# Patient Record
Sex: Male | Born: 1980 | Race: White | Hispanic: No | Marital: Single | Smoking: Never smoker
Health system: Southern US, Community
[De-identification: ages and names within clinical notes are randomized; demographics above are authoritative.]

## PROBLEM LIST (undated history)

## (undated) DIAGNOSIS — T783XXA Angioneurotic edema, initial encounter: Secondary | ICD-10-CM

## (undated) HISTORY — PX: SINOSCOPY: SHX187

## (undated) HISTORY — DX: Angioneurotic edema, initial encounter: T78.3XXA

---

## 2019-09-18 ENCOUNTER — Telehealth: Payer: Self-pay | Admitting: Allergy & Immunology

## 2019-09-18 NOTE — Telephone Encounter (Signed)
Patient called over the weekend to let me know that he was diagnosed with COVID19 over Christmas. Diagnosis date was actually December 24th. I told him that we would have to reschedule him. 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   Please call on Monday December 28th to reschedule.  Salvatore Marvel, MD [facility] [place] [facility] [place]

## 2019-09-20 ENCOUNTER — Ambulatory Visit: Payer: Self-pay | Admitting: Allergy and Immunology

## 2019-10-15 ENCOUNTER — Other Ambulatory Visit: Payer: Self-pay

## 2019-10-15 ENCOUNTER — Emergency Department (HOSPITAL_COMMUNITY)
Admission: EM | Admit: 2019-10-15 | Discharge: 2019-10-15 | Disposition: A | Discharge status: Home / Self Care | Attending: Emergency Medicine | Admitting: Emergency Medicine

## 2019-10-15 ENCOUNTER — Emergency Department (HOSPITAL_COMMUNITY)

## 2019-10-15 ENCOUNTER — Encounter (HOSPITAL_COMMUNITY): Payer: Self-pay | Admitting: Emergency Medicine

## 2019-10-15 DIAGNOSIS — S92252A Displaced fracture of navicular [scaphoid] of left foot, initial encounter for closed fracture: Secondary | ICD-10-CM | POA: Insufficient documentation

## 2019-10-15 DIAGNOSIS — X500XXA Overexertion from strenuous movement or load, initial encounter: Secondary | ICD-10-CM | POA: Insufficient documentation

## 2019-10-15 DIAGNOSIS — Y929 Unspecified place or not applicable: Secondary | ICD-10-CM | POA: Diagnosis not present

## 2019-10-15 DIAGNOSIS — Y939 Activity, unspecified: Secondary | ICD-10-CM | POA: Diagnosis not present

## 2019-10-15 DIAGNOSIS — S99912A Unspecified injury of left ankle, initial encounter: Secondary | ICD-10-CM | POA: Diagnosis present

## 2019-10-15 DIAGNOSIS — Y999 Unspecified external cause status: Secondary | ICD-10-CM | POA: Insufficient documentation

## 2019-10-15 NOTE — ED Notes (Signed)
Ortho tech paged  

## 2019-10-15 NOTE — ED Notes (Signed)
Verbalized understanding of d/c instruction, follow up care and pain management. Pt had no further questions at this time

## 2019-10-15 NOTE — Progress Notes (Signed)
Orthopedic Tech Progress Note Patient Details:  Nathan Middleton May 11, 1981 [id number]  Ortho Devices Type of Ortho Device: CAM walker Ortho Device/Splint Location: lle Ortho Device/Splint Interventions: Ordered, Application, Adjustment   Post Interventions Patient Tolerated: Well Instructions Provided: Care of device, Adjustment of device   Trinna Post 10/15/2019, 11:29 PM

## 2019-10-15 NOTE — Discharge Instructions (Addendum)
We recommend to wear boot for now. Continue to ice/elevate ankle to help control swelling.  Can take 800mg  motrin up to 3x daily. Follow-up with orthopedics-- call their office as soon as you can to make follow-up appt. Return here for any new/acute changes.

## 2019-10-15 NOTE — ED Provider Notes (Signed)
[facility] EMERGENCY DEPARTMENT Provider Note   CSN: [id number] Arrival date & time: 10/15/19  2122     History Chief Complaint  Patient presents with  . Left Ankle Injury    Nathan Middleton is a 39 y.o. male.  The history is provided by the patient and medical records.    39 y.o. M here with left ankle injury.  States he has twisted his ankle twice over the past week after stepping awkwardly on an incline at work.  He has been wearing ace wrap and tightening his boots more which has been helping.  Denies numbness/weakness.  He remains ambulatory with fairly little pain.  History reviewed. No pertinent past medical history.  There are no problems to display for this patient.   History reviewed. No pertinent surgical history.     No family history on file.  Social History   Tobacco Use  . Smoking status: Never Smoker  . Smokeless tobacco: Never Used  Substance Use Topics  . Alcohol use: Yes  . Drug use: Never    Home Medications Prior to Admission medications   Not on File    Allergies    Penicillins and Sulfa antibiotics  Review of Systems   Review of Systems  Musculoskeletal: Positive for arthralgias.  All other systems reviewed and are negative.   Physical Exam Updated Vital Signs BP (!) 123/94 (BP Location: Right Arm)   Pulse 100   Temp 98.7 F (37.1 C) (Oral)   Resp 14   SpO2 96%   Physical Exam Vitals and nursing note reviewed.  Constitutional:      Appearance: He is well-developed.  HENT:     Head: Normocephalic and atraumatic.  Eyes:     Conjunctiva/sclera: Conjunctivae normal.     Pupils: Pupils are equal, round, and reactive to light.  Cardiovascular:     Rate and Rhythm: Normal rate and regular rhythm.     Heart sounds: Normal heart sounds.  Pulmonary:     Effort: Pulmonary effort is normal.     Breath sounds: Normal breath sounds.  Abdominal:     General: Bowel sounds are normal.     Palpations: Abdomen is  soft.  Musculoskeletal:        General: Normal range of motion.     Cervical back: Normal range of motion.     Comments: Left ankle with swelling around the medial and lateral malleoli, no acute deformity, some very mild tenderness along proximal dorsal foot without gross deformity; some bruising along base of the heel, no open wounds/sores; DP pulse intact; full ROM maintained, some mild pain with dorsal/plantarflexion, good cap refill and distal sensation  Skin:    General: Skin is warm and dry.  Neurological:     Mental Status: He is alert and oriented to person, place, and time.     ED Results / Procedures / Treatments   Labs (all labs ordered are listed, but only abnormal results are displayed) Labs Reviewed - No data to display  EKG None  Radiology DG Ankle Complete Left  Result Date: 10/15/2019 CLINICAL DATA:  Pain and swelling EXAM: LEFT ANKLE COMPLETE - 3+ VIEW COMPARISON:  None. FINDINGS: Age indeterminate avulsion versus ossicle off the dorsal navicular. Ossicle or old injury off the medial malleolus. Ankle mortise is symmetric. Generalized soft tissue swelling. IMPRESSION: 1. Age indeterminate avulsion versus ossicle off the dorsal navicular, correlate for point tenderness 2. Soft tissue swelling. Ossicle or old injury of adjacent to the  medial malleolus Electronically Signed   By: Donavan Foil M.D.   On: 10/15/2019 22:20    Procedures Procedures (including critical care time)  Medications Ordered in ED Medications - No data to display  ED Course  I have reviewed the triage vital signs and the nursing notes.  Pertinent labs & imaging results that were available during my care of the patient were reviewed by me and considered in my medical decision making (see chart for details).    MDM Rules/Calculators/A&P  39 year old male here with right ankle injury x2 over the past week.  Does have some swelling and bruising on exam but no significant bony deformity.  He  remains ambulatory.  Does have some pain with dorsal and plantar flexion.  Foot is neurovascular intact.  X-ray concerning for avulsion fracture of the dorsal navicular, questionable old injury of the medial malleolus.  Patient without any significant pain, he is ambulatory without limp or antalgic gait.  Will place in cam walker and have him follow-up with orthopedics.  Can continue Tylenol or Motrin for pain, ice and elevation to help with swelling.  Return here for any new or acute changes.  Final Clinical Impression(s) / ED Diagnoses Final diagnoses:  Closed avulsion fracture of navicular bone of left foot, initial encounter    Rx / DC Orders ED Discharge Orders    None       Larene Pickett, PA-C 10/16/19 0124    Malvin Johns, MD 10/16/19 1537

## 2019-10-15 NOTE — ED Triage Notes (Signed)
Patient accidentally twisted his left ankle today , it was previously injured " sprained" last Tuesday at work , present with swelling/redness.

## 2019-10-18 ENCOUNTER — Other Ambulatory Visit: Payer: Self-pay

## 2019-10-18 ENCOUNTER — Ambulatory Visit (INDEPENDENT_AMBULATORY_CARE_PROVIDER_SITE_OTHER): Admitting: Orthopedic Surgery

## 2019-10-18 ENCOUNTER — Encounter: Payer: Self-pay | Admitting: Orthopedic Surgery

## 2019-10-18 VITALS — Ht 70.0 in | Wt 230.2 lb

## 2019-10-18 DIAGNOSIS — S93412A Sprain of calcaneofibular ligament of left ankle, initial encounter: Secondary | ICD-10-CM | POA: Diagnosis not present
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
## 2020-06-10 IMAGING — DX DG ANKLE COMPLETE 3+V*L*
3 series · 3 of 3 positions shown · non-contrast
Comparison: None.

CLINICAL DATA: Pain and swelling

EXAM:
LEFT ANKLE COMPLETE - 3+ VIEW

[ankle ap]
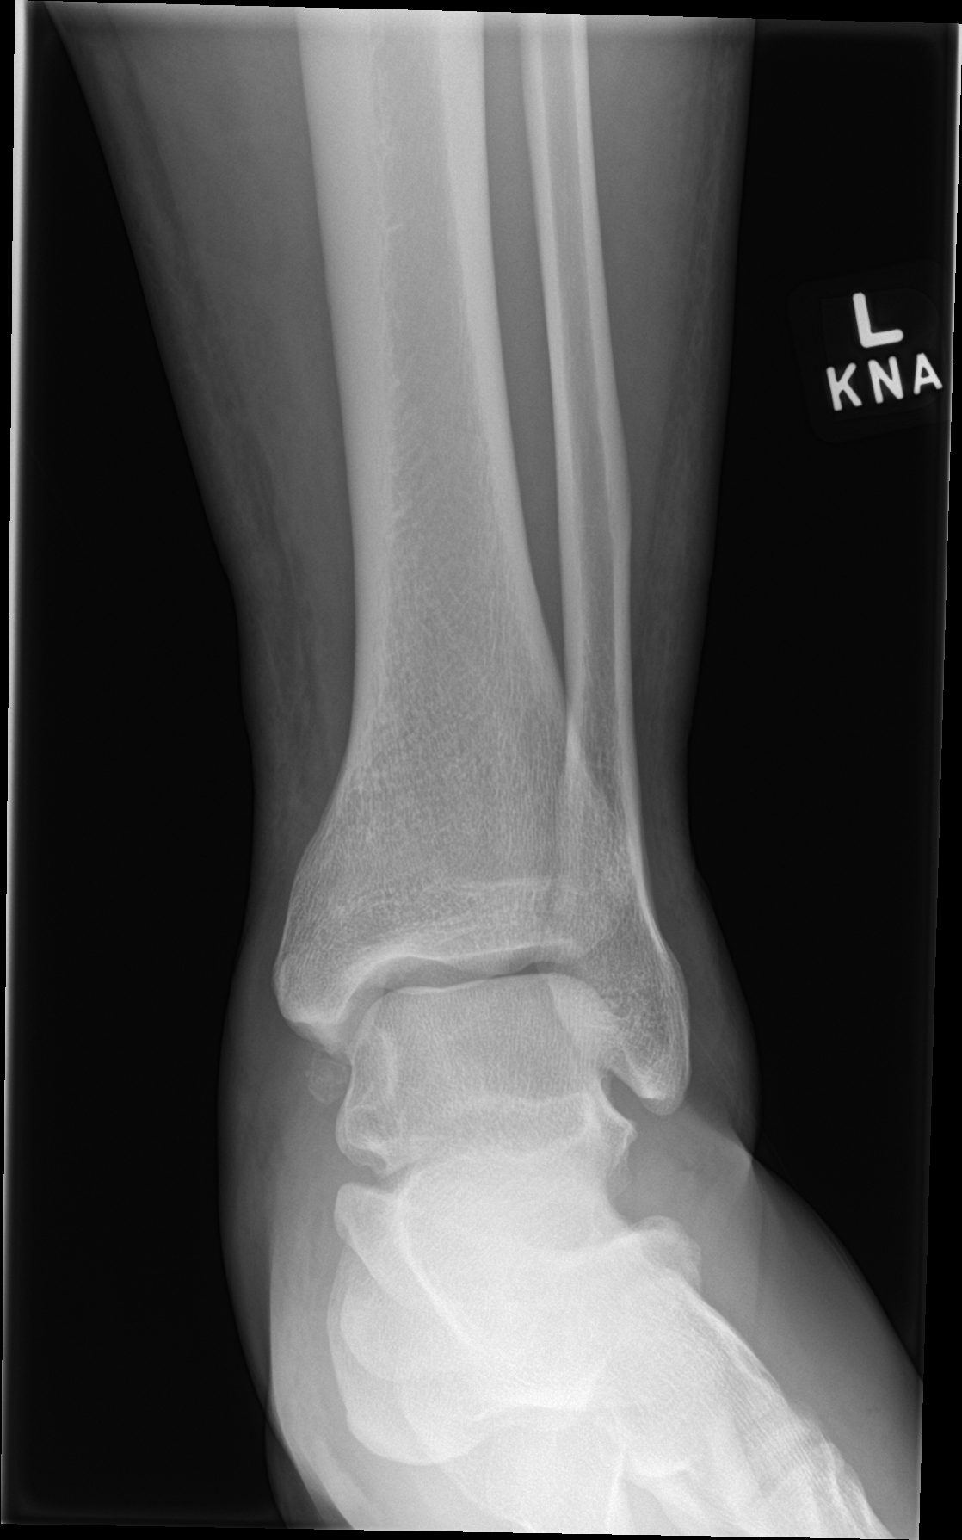

[ankle obl]
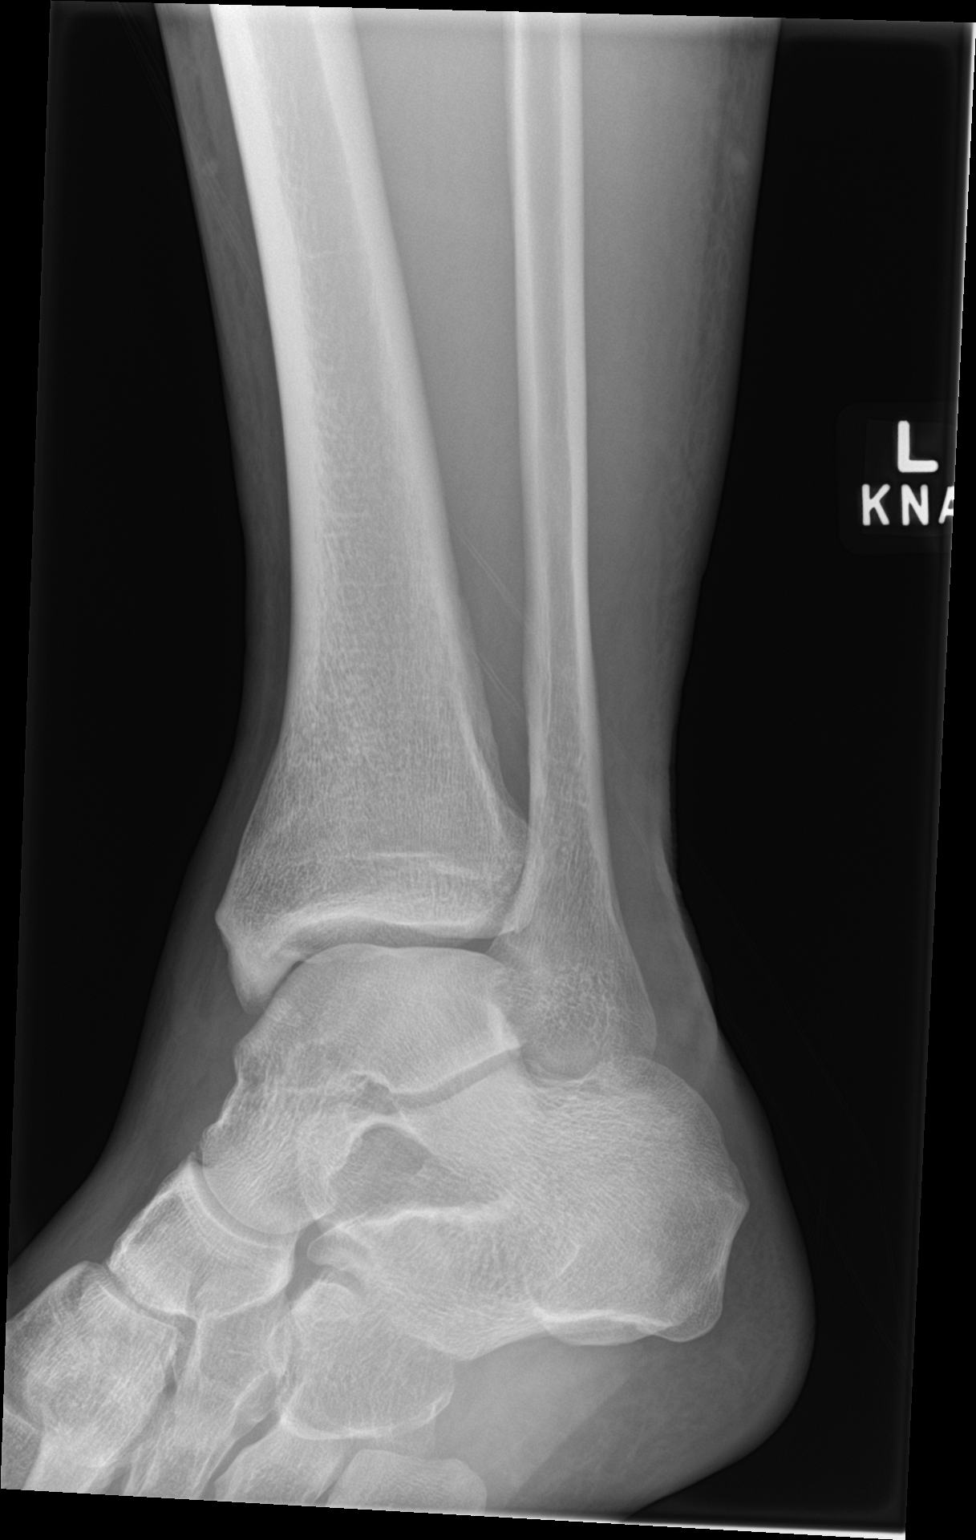

[ankle lat]
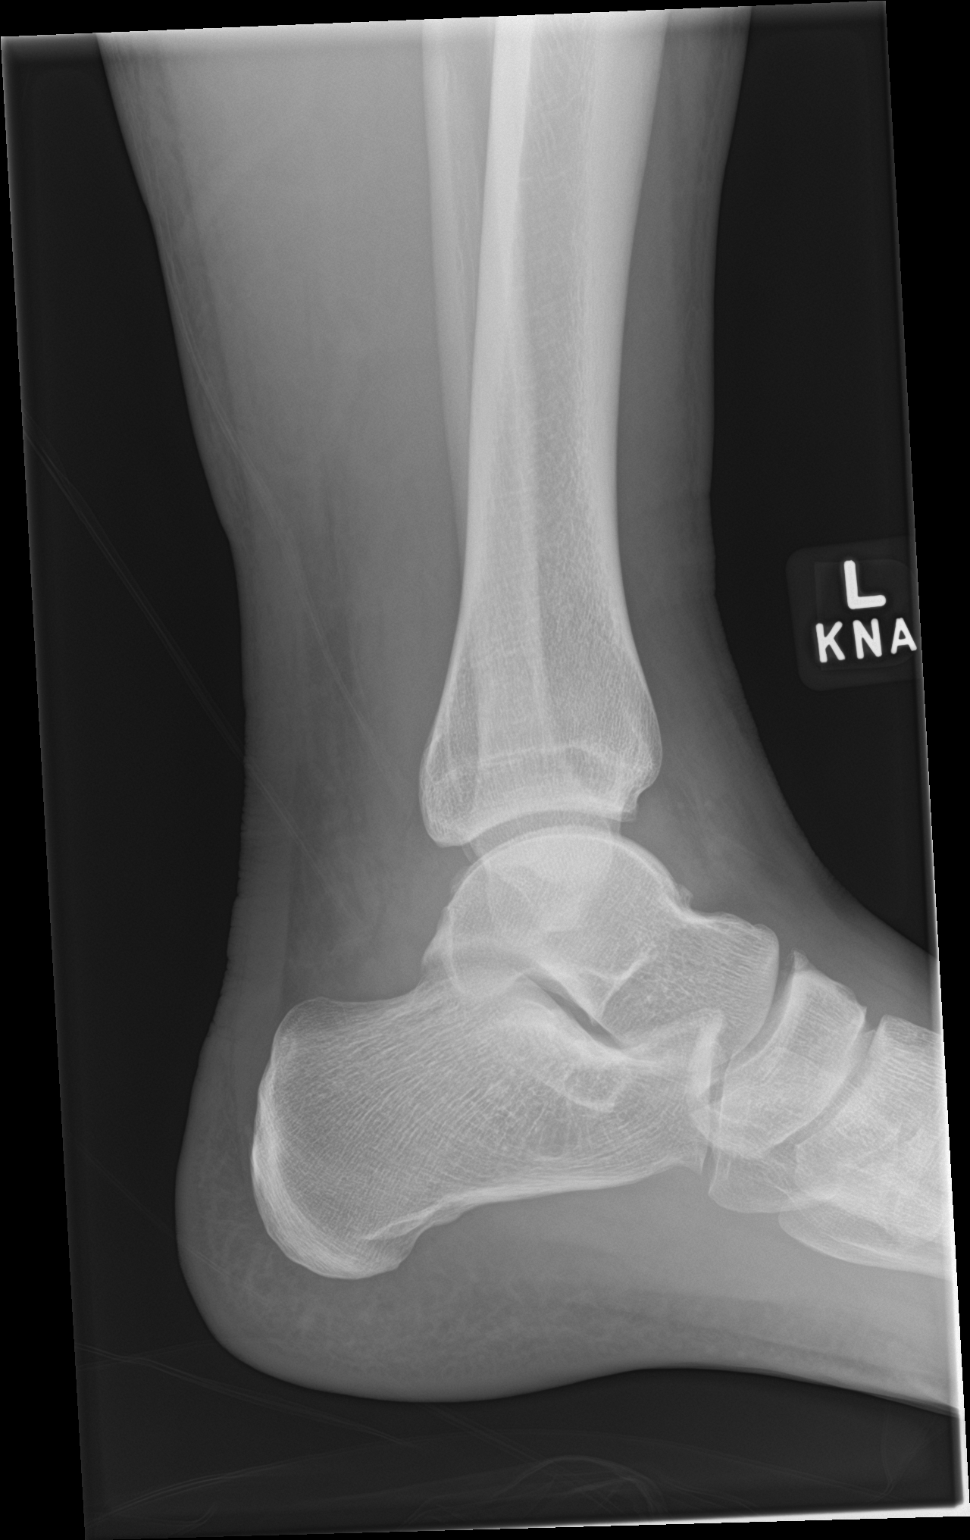

[3 of 3 positions shown; findings below may reference images not displayed]

FINDINGS: Age indeterminate avulsion versus ossicle off the dorsal navicular.
Ossicle or old injury off the medial malleolus. Ankle mortise is
symmetric. Generalized soft tissue swelling.
IMPRESSION: 1. Age indeterminate avulsion versus ossicle off the dorsal
navicular, correlate for point tenderness
2. Soft tissue swelling. Ossicle or old injury of adjacent to the
medial malleolus

## 2020-10-27 ENCOUNTER — Ambulatory Visit: Admitting: Family Medicine

## 2020-12-19 ENCOUNTER — Ambulatory Visit (INDEPENDENT_AMBULATORY_CARE_PROVIDER_SITE_OTHER): Admitting: Allergy

## 2020-12-19 ENCOUNTER — Encounter: Payer: Self-pay | Admitting: Allergy

## 2020-12-19 ENCOUNTER — Other Ambulatory Visit: Payer: Self-pay

## 2020-12-19 VITALS — BP 140/92 | HR 86 | Temp 97.5°F | Resp 16 | Ht 71.2 in | Wt 248.8 lb

## 2020-12-19 DIAGNOSIS — J3089 Other allergic rhinitis: Secondary | ICD-10-CM | POA: Diagnosis not present

## 2020-12-19 DIAGNOSIS — Z91030 Bee allergy status: Secondary | ICD-10-CM | POA: Diagnosis not present

## 2020-12-19 DIAGNOSIS — T781XXD Other adverse food reactions, not elsewhere classified, subsequent encounter: Secondary | ICD-10-CM | POA: Diagnosis not present

## 2020-12-19 DIAGNOSIS — T7819XD Other adverse food reactions, not elsewhere classified, subsequent encounter: Secondary | ICD-10-CM | POA: Insufficient documentation

## 2020-12-19 NOTE — Patient Instructions (Addendum)
Today's skin testing showed: Positive to mold.  Bee stings:  Continue to avoid. Get bloodwork:  We are ordering labs, so please allow 1-2 weeks for the results to come back. With the newly implemented Cures Act, the labs might be visible to you at the same time that they become visible to me. However, I will not address the results until all of the results are back, so please be patient.  In the meantime, continue recommendations in your patient instructions, including avoidance measures (if applicable), until you hear from me.  If bloodwork is negative, then we will schedule you for skin testing to the hymenoptera panel at our [facility] office next - this is done every other month.   For mild symptoms you can take over the counter antihistamines such as Benadryl and monitor symptoms closely. If symptoms worsen or if you have severe symptoms including breathing issues, throat closure, significant swelling, whole body hives, severe diarrhea and vomiting, lightheadedness then seek immediate medical care.  Food:  Discussed with patient at length that skin prick testing and bloodwork (food IgE levels) check for IgE mediated reactions which his clinical presentation does not support.   Keep a food journal with symptoms and foods eaten.  Continue to avoid foods that seem to be bothersome - gluten, tomatoes, dairy products.  May have some lactose intolerance - try lactaid pills or lactose free milk.   Follow up with GI if still having issues.  Environmental allergies  Start environmental control measures as below.  May use over the counter antihistamines such as Zyrtec (cetirizine), Claritin (loratadine), Allegra (fexofenadine), or Xyzal (levocetirizine) daily as needed.  May use Flonase (fluticasone) nasal spray 1 spray per nostril twice a day as needed for nasal congestion.   Nasal saline spray (i.e., Simply Saline) or nasal saline lavage (i.e., NeilMed) is recommended as needed and  prior to medicated nasal sprays.  Follow up depending on bloodwork results.  Mold Control . Mold and fungi can grow on a variety of surfaces provided certain temperature and moisture conditions exist.  . Outdoor molds grow on plants, decaying vegetation and soil. The major outdoor mold, Alternaria and Cladosporium, are found in very high numbers during hot and dry conditions. Generally, a late summer - fall peak is seen for common outdoor fungal spores. Rain will temporarily lower outdoor mold spore count, but counts rise rapidly when the rainy period ends. . The most important indoor molds are Aspergillus and Penicillium. Dark, humid and poorly ventilated basements are ideal sites for mold growth. The next most common sites of mold growth are the bathroom and the kitchen. Outdoor (Seasonal) Mold Control . Use air conditioning and keep windows closed. . Avoid exposure to decaying vegetation. Marland Kitchen Avoid leaf raking. . Avoid grain handling. . Consider wearing a face mask if working in moldy areas.  Indoor (Perennial) Mold Control  . Maintain humidity below 50%. . Get rid of mold growth on hard surfaces with water, detergent and, if necessary, 5% bleach (do not mix with other cleaners). Then dry the area completely. If mold covers an area more than 10 square feet, consider hiring an indoor environmental professional. . For clothing, washing with soap and water is best. If moldy items cannot be cleaned and dried, throw them away. . Remove sources e.g. contaminated carpets. . Repair and seal leaking roofs or pipes. Using dehumidifiers in damp basements may be helpful, but empty the water and clean units regularly to prevent mildew from forming. All rooms, especially  basements, bathrooms and kitchens, require ventilation and cleaning to deter mold and mildew growth. Avoid carpeting on concrete or damp floors, and storing items in damp areas.

## 2020-12-19 NOTE — Progress Notes (Signed)
New Patient Note  RE: Nathan BookbinderWilliam Middleton MRN: [id number] DOB: 29-Sep-1980 Date of Office Visit: 12/19/2020  Referring provider: No ref. provider found Primary care provider: Patient, No Pcp Per  Chief Complaint: Allergy Testing (Environmental and Food )  History of Present Illness: I had the pleasure of seeing Nathan BookbinderWilliam Middleton for initial evaluation at the [facility] on 12/19/2020. He is a 40 y.o. male, who is self-referred here for the evaluation of allergy testing.  Bee stings: Patient got stung as a child three times and had some episodes shortness of breath. He doesn't recall what he was treated with but no hospitalization or Epipen was used.  Apparently he had some testing done as a child which was positive per patient report.  Last sting was in the 1997.  No up to date Epipen in the 4-5 years.   Food: Patient noticed when he eats [place] foods, noodles and bread causes abdominal bloating, increased gas and some facial discomfort. This has been going on for the past 6-12 months. Symptoms usually occur within 30 minutes. Symptoms subside within 1/2 day but didn't take any medications for this.  Denies any hives.  Fermented dairy products caused abdominal bloating and gassiness but no issues with ultra-pasteurized milk.  Past work up includes: none. Dietary History: patient has been eating other foods including limited milk, eggs, peanut, treenuts, sesame, shellfish, fish, soy, limited wheat, meats, fruits and vegetables.  No prior GI evaluation.   Environmental: He reports symptoms of nasal congestion, headaches, rhinorrhea, sneezing. Symptoms have been going on for many years. The symptoms are present mainly in the late fall. Anosmia: no. Headache: yes. He has used saline irrigation and Afrin prn. Sinus infections: no. Previous work up includes: no. Previous ENT evaluation: had sinus surgery in 2007. Previous sinus imaging: not recently. History of nasal polyps:  no. History of reflux: not sure.  Assessment and Plan: [name] is a 40 y.o. male with: H/O bee sting allergy Shortness of breath after stings as a child. Apparently he had some type of testing and was confirmed to have sensitivities. No prior AIT and no prior Epipen use. His job requiring update on this status.  Continue to avoid. Get bloodwork - hymenoptera panel and tryptase level.   If bloodwork is negative, then we will schedule for skin testing [facility]optera [facility] - this is done every other month.  If bloodwork is positive, will offer immunotherapy.   For mild symptoms you can take over the counter antihistamines such as Benadryl and monitor symptoms closely. If symptoms worsen or if you have severe symptoms including breathing issues, throat closure, significant swelling, whole body hives, severe diarrhea and vomiting, lightheadedness then seek immediate medical care.  Declines epinephrine prescription today.   Other allergic rhinitis Rhinitis symptoms mainly in the fall. Uses saline irrigation and Afrin prn with good benefit. No prior work up. Sinus surgery in 2007.  Today's skin testing was positive to molds.   Start environmental control measures as below.  May use over the counter antihistamines such as Zyrtec (cetirizine), Claritin (loratadine), Allegra (fexofenadine), or Xyzal (levocetirizine) daily as needed.  May use Flonase (fluticasone) nasal spray 1 spray per nostril twice a day as needed for nasal congestion.   Nasal saline spray (i.e., Simply Saline) or nasal saline lavage (i.e., NeilMed) is recommended as needed and prior to medicated nasal sprays.  Adverse reaction to food, subsequent encounter Noticed abdominal bloating with [place] foods,  wheat products and fermented dairy. No prior GI evaluation. Usually resolves within 1/2 day with no intervention. concerned about food allergies.  Today's skin testing was negative to  wheat, tomato, dairy and casein.   Discussed with patient at length that skin prick testing and bloodwork (food IgE levels) check for IgE mediated reactions which his clinical presentation does not support.   Keep a food journal with symptoms and foods eaten.  Continue to avoid foods that seem to be bothersome - gluten, tomatoes, dairy products.  May have some lactose intolerance - try lactaid pills or lactose free milk.   Follow up with GI if still having issues.  Return if symptoms worsen or fail to improve.  No orders of the defined types were placed in this encounter.   Lab Orders     Tryptase     Allergen Hymenoptera Panel  Other allergy screening: Asthma: no Medication allergy: yes Urticaria: no Eczema:no History of recurrent infections suggestive of immunodeficency: no  Diagnostics: Skin Testing: Environmental allergy panel and select foods. Positive test to: mold. Negative test to: select foods.  Results discussed with patient/family.  Airborne Adult Perc - 12/19/20 0923    Time Antigen Placed 0930    Allergen Manufacturer Waynette Buttery    Location Back    Number of Test 59    Panel 1 Select    1. Control-Buffer 50% Glycerol Negative    2. Control-Histamine 1 mg/ml 2+    3. Albumin saline Negative    4. Bahia Negative    5. French Southern Territories Negative    6. Johnson Negative    7. Kentucky Blue Negative    8. Meadow Fescue Negative    9. Perennial Rye Negative    10. Sweet Vernal Negative    11. Timothy Negative    12. Cocklebur Negative    13. Burweed Marshelder Negative    14. Ragweed, short Negative    15. Ragweed, Giant Negative    16. Plantain,  English Negative    17. Lamb's Quarters Negative    18. Sheep Sorrell Negative    19. Rough Pigweed Negative    20. Marsh Elder, Rough Negative    21. Mugwort, Common Negative    22. Ash mix Negative    23. Birch mix Negative    24. Beech American Negative    25. Box, Elder Negative    26. Cedar, red Negative    27.  Cottonwood, Guinea[place]issau Negative    28. Elm mix Negative    29. Hickory Negative    30. Maple mix Negative    31. Oak, [place] mix Negative    32. Pecan Pollen Negative    33. Pine mix Negative    34. Sycamore Eastern Negative    35. Walnut, Black Pollen Negative    36. Alternaria alternata Negative    37. Cladosporium Herbarum Negative    38. Aspergillus mix Negative    39. Penicillium mix Negative    40. Bipolaris sorokiniana (Helminthosporium) Negative    41. Drechslera spicifera (Curvularia) Negative    42. Mucor plumbeus Negative    43. Fusarium moniliforme Negative    44. Aureobasidium pullulans (pullulara) Negative    45. Rhizopus oryzae Negative    46. Botrytis cinera Negative    47. Epicoccum nigrum Negative    48. Phoma betae Negative    49. Candida Albicans Negative    50. Trichophyton mentagrophytes 2+    51. Mite, D Farinae  5,000 AU/ml Negative    52.  Mite, D Pteronyssinus  5,000 AU/ml Negative    53. Cat Hair 10,000 BAU/ml Negative    54.  Dog Epithelia Negative    55. Mixed Feathers Negative    56. Horse Epithelia Negative    57. Cockroach, German Negative    58. Mouse Negative    59. Tobacco Leaf Negative          Intradermal - 12/19/20 0951    Time Antigen Placed 2376    Allergen Manufacturer Waynette Buttery    Location Arm    Number of Test 15    Intradermal Select    Control Negative    French Southern Territories Negative    Johnson Negative    7 Grass Negative    Ragweed mix Negative    Weed mix Negative    Tree mix Negative    Mold 1 Negative    Mold 2 --   +/-   Mold 3 --   +/-   Mold 4 Negative    Cat Negative    Dog Negative    Cockroach Negative    Mite mix Negative          Food Adult Perc - 12/19/20 1000    Time Antigen Placed 0930    Allergen Manufacturer Greer    Location Back    Number of allergen test 4    3. Wheat Negative    5. Milk, cow Negative    7. Casein Negative    42. Tomato Negative           Past Medical History: Patient Active  Problem List   Diagnosis Date Noted  . H/O bee sting allergy 12/19/2020  . Other allergic rhinitis 12/19/2020  . Adverse reaction to food, subsequent encounter 12/19/2020   Past Medical History:  Diagnosis Date  . Angio-edema    Past Surgical History: Past Surgical History:  Procedure Laterality Date  . SINOSCOPY     Medication List:  Current Outpatient Medications  Medication Sig Dispense Refill  . aspirin 325 MG EC tablet Take 325 mg by mouth daily.     No current facility-administered medications for this visit.   Allergies: Allergies  Allergen Reactions  . Chantix [Varenicline]     Other reaction(s): Nightmares  . Other Swelling  . Penicillins   . No Known Allergies Rash  . Sulfa Antibiotics Rash and Hives   Social History: Social History   Socioeconomic History  . Marital status: Single    Spouse name: Not on file  . Number of children: Not on file  . Years of education: Not on file  . Highest education level: Not on file  Occupational History  . Not on file  Tobacco Use  . Smoking status: Never Smoker  . Smokeless tobacco: Never Used  Vaping Use  . Vaping Use: Never used  Substance and Sexual Activity  . Alcohol use: Yes  . Drug use: Never  . Sexual activity: Not on file  Other Topics Concern  . Not on file  Social History Narrative  . Not on file   Social Determinants of Health   Financial Resource Strain: Not on file  Food Insecurity: Not on file  Transportation Needs: Not on file  Physical Activity: Not on file  Stress: Not on file  Social Connections: Not on file   Lives in a 38 year old apartment. Smoking: denies Occupation: truck Interior and spatial designer HistorySurveyor, minerals in the house: no Carpet in the family room: yes Carpet in the bedroom:  yes Heating: electric Cooling: central Pet: no  Family History: Family History  Problem Relation Age of Onset  . Allergic rhinitis Brother   . Asthma Neg Hx   . Eczema Neg Hx    . Urticaria Neg Hx    Review of Systems  Constitutional: Negative for appetite change, chills, fever and unexpected weight change.  HENT: Positive for congestion. Negative for rhinorrhea.   Eyes: Negative for itching.  Respiratory: Negative for cough, chest tightness, shortness of breath and wheezing.   Cardiovascular: Negative for chest pain.  Gastrointestinal: Negative for abdominal pain.  Genitourinary: Negative for difficulty urinating.  Skin: Negative for rash.  Allergic/Immunologic: Positive for environmental allergies.  Neurological: Positive for headaches.   Objective: BP (!) 140/92 (BP Location: Left Arm, Patient Position: Sitting, Cuff Size: Normal)   Pulse 86   Temp (!) 97.5 F (36.4 C) (Temporal)   Resp 16   Ht 5' 11.2" (1.808 m)   Wt 248 lb 12 oz (112.8 kg)   SpO2 96%   BMI 34.50 kg/m  Body mass index is 34.5 kg/m. Physical Exam Vitals and nursing note reviewed.  Constitutional:      Appearance: Normal appearance. He is well-developed.  HENT:     Head: Normocephalic and atraumatic.     Right Ear: External ear normal.     Left Ear: Tympanic membrane and external ear normal.     Nose: Rhinorrhea present.     Mouth/Throat:     Mouth: Mucous membranes are moist.     Pharynx: Oropharynx is clear.  Eyes:     Conjunctiva/sclera: Conjunctivae normal.  Cardiovascular:     Rate and Rhythm: Normal rate and regular rhythm.     Heart sounds: Normal heart sounds. No murmur heard. No friction rub. No gallop.   Pulmonary:     Effort: Pulmonary effort is normal.     Breath sounds: Normal breath sounds. No wheezing, rhonchi or rales.  Musculoskeletal:     Cervical back: Neck supple.  Skin:    General: Skin is warm.     Findings: No rash.  Neurological:     Mental Status: He is alert and oriented to person, place, and time.  Psychiatric:        Behavior: Behavior normal.    The plan was reviewed with the patient/family, and all questions/concerned were  addressed.  It was my pleasure to see Lev today and participate in his care. Please feel free to contact me with any questions or concerns.  Sincerely,  Wyline Mood, DO Allergy & Immunology  [facility] office: [phone] [facility] office: [phone]

## 2020-12-19 NOTE — Assessment & Plan Note (Signed)
Noticed abdominal bloating with [place] foods, wheat products and fermented dairy. No prior GI evaluation. Usually resolves within 1/2 day with no intervention. concerned about food allergies.  Today's skin testing was negative to wheat, tomato, dairy and casein.   Discussed with patient at length that skin prick testing and bloodwork (food IgE levels) check for IgE mediated reactions which his clinical presentation does not support.   Keep a food journal with symptoms and foods eaten.  Continue to avoid foods that seem to be bothersome - gluten, tomatoes, dairy products.  May have some lactose intolerance - try lactaid pills or lactose free milk.   Follow up with GI if still having issues.

## 2020-12-19 NOTE — Assessment & Plan Note (Addendum)
Rhinitis symptoms mainly in the fall. Uses saline irrigation and Afrin prn with good benefit. No prior work up. Sinus surgery in 2007.  Today's skin testing was positive to molds.   Start environmental control measures as below.  May use over the counter antihistamines such as Zyrtec (cetirizine), Claritin (loratadine), Allegra (fexofenadine), or Xyzal (levocetirizine) daily as needed.  May use Flonase (fluticasone) nasal spray 1 spray per nostril twice a day as needed for nasal congestion.   Nasal saline spray (i.e., Simply Saline) or nasal saline lavage (i.e., NeilMed) is recommended as needed and prior to medicated nasal sprays.

## 2020-12-19 NOTE — Assessment & Plan Note (Signed)
Shortness of breath after stings as a child. Apparently he had some type of testing and was confirmed to have sensitivities. No prior AIT and no prior Epipen use. His job requiring update on this status.  Continue to avoid. Get bloodwork - hymenoptera panel and tryptase level.   If bloodwork is negative, then we will schedule for skin testing to the hymenoptera panel at our [facility] office next - this is done every other month.  If bloodwork is positive, will offer immunotherapy.   For mild symptoms you can take over the counter antihistamines such as Benadryl and monitor symptoms closely. If symptoms worsen or if you have severe symptoms including breathing issues, throat closure, significant swelling, whole body hives, severe diarrhea and vomiting, lightheadedness then seek immediate medical care.  Declines epinephrine prescription today.

## 2020-12-28 LAB — ALLERGEN HYMENOPTERA PANEL
Bumblebee: <0.1 kU/L
Honeybee IgE: <0.1 kU/L
Hornet, White Face, IgE: <0.1 kU/L
Hornet, Yellow, IgE: <0.1 kU/L
Paper Wasp IgE: <0.1 kU/L
Yellow Jacket, IgE: <0.1 kU/L

## 2020-12-28 LAB — TRYPTASE: Tryptase: 14.7 ug/L — ABNORMAL HIGH (ref 2.2–13.2)

## 2021-05-01 ENCOUNTER — Encounter: Payer: Self-pay | Admitting: Family

## 2021-05-01 ENCOUNTER — Other Ambulatory Visit: Payer: Self-pay

## 2021-05-01 ENCOUNTER — Ambulatory Visit (INDEPENDENT_AMBULATORY_CARE_PROVIDER_SITE_OTHER): Admitting: Family

## 2021-05-01 VITALS — BP 116/84 | HR 88 | Temp 98.6°F | Resp 20 | Ht 70.5 in | Wt 245.2 lb

## 2021-05-01 DIAGNOSIS — Z91030 Bee allergy status: Secondary | ICD-10-CM | POA: Diagnosis not present

## 2021-05-01 DIAGNOSIS — R899 Unspecified abnormal finding in specimens from other organs, systems and tissues: Secondary | ICD-10-CM

## 2021-05-01 NOTE — Progress Notes (Signed)
[address] [place] [address] Dept: [phone]  FOLLOW UP NOTE  Patient ID: Nathan Middleton, male    DOB: 05/14/1981  Age: 40 y.o. MRN: [id number] Date of Office Visit: 05/01/2021  Assessment  Chief Complaint: Follow-up and Other (Venom)  HPI Nathan Middleton is a 40 year old male that presents today for skin testing to bees. He was last seen on 12/19/2020 by Dr. Selena Batten for  history of bee sting, allergic rhinitis, and adverse reaction to food.   He reports since his last office visit he has not had any bee stings, but he has had bee bites.He reports that when he has a bee bite that the area " swells up". He denies any cardiorespiratory and gastrointestinal symptoms. He reports that 2 years ago he had a monster english bite on his back and he just had swelling at the bite site. A medic was with him at the time. He does not have an epinephrine auto-injector device, but reports that he used to carry one because he was told to.  He reports that as a child he was stung 3 times and had some episodes of shortness of breath.  He does not remember what he was treated with and he is not certain if epinephrine was used.  His last sting was in 1997.   Drug Allergies:  Allergies  Allergen Reactions   Chantix [Varenicline]     Other reaction(s): Nightmares   Other Swelling   Penicillins    No Known Allergies Rash   Sulfa Antibiotics Rash and Hives    Review of Systems: Review of Systems  Constitutional:  Negative for chills and fever.  HENT:         Denies rhinorrhea, nasal congestion and post nasal drip  Eyes:        Denies itchy watery eyes  Respiratory:  Negative for cough, shortness of breath and wheezing.   Cardiovascular:  Negative for chest pain and palpitations.  Gastrointestinal:  Negative for abdominal pain, diarrhea, nausea and vomiting.  Genitourinary:  Negative for dysuria.  Skin:  Negative for itching and rash.  Neurological:  Negative for headaches.     Physical Exam: BP 116/84   Pulse 88   Temp 98.6 F (37 C) (Temporal)   Resp 20   Ht 5' 10.5" (1.791 m)   Wt 245 lb 3.2 oz (111.2 kg)   SpO2 98%   BMI 34.69 kg/m    Physical Exam Constitutional:      Appearance: Normal appearance.  HENT:     Head: Normocephalic and atraumatic.     Comments: Pharynx normal. Eyes normal. Ears normal. Nose normal    Right Ear: Tympanic membrane, ear canal and external ear normal.     Left Ear: Tympanic membrane, ear canal and external ear normal.     Nose: Nose normal.     Mouth/Throat:     Mouth: Mucous membranes are moist.     Pharynx: Oropharynx is clear.  Eyes:     Conjunctiva/sclera: Conjunctivae normal.  Cardiovascular:     Rate and Rhythm: Regular rhythm.     Heart sounds: Normal heart sounds.  Pulmonary:     Effort: Pulmonary effort is normal.     Breath sounds: Normal breath sounds.     Comments: Lungs clear to auscultation Musculoskeletal:     Cervical back: Neck supple.  Skin:    General: Skin is warm.     Comments: No rashes or urticarial lesions noted  Neurological:  Mental Status: He is alert and oriented to person, place, and time.  Psychiatric:        Mood and Affect: Mood normal.        Behavior: Behavior normal.        Thought Content: Thought content normal.        Judgment: Judgment normal.    Diagnostics: Percutaneous and intradermal testing to honeybee, yellow jacket, yellow hornet, white hornet, and wasp were negative today with a good histamine response.  Skin test results were interpreted by myself and Dr. Beaulah Dinning.  Assessment and Plan: 1. H/O bee sting allergy   2. Abnormal laboratory test result     No orders of the defined types were placed in this encounter.   Patient Instructions  History of bee sting allergy Your hymenoptera panel on December 22, 2020 was negative and percutaneous and intradermal testing today was negative For mild symptoms you can take over the counter antihistamines such  as Benadryl and monitor symptoms closely. If symptoms worsen or if you have severe symptoms including breathing issues, throat closure, significant swelling, whole body hives, severe diarrhea and vomiting, lightheadedness then seek immediate medical care  We will order a repeat tryptase level since your last level was slightly elevated. We will call you with results once they are back  Schedule a follow up appointment in 6 months or sooner if needed  Return in about 6 months (around 11/01/2021), or if symptoms worsen or fail to improve.    Thank you for the opportunity to care for this patient.  Please do not hesitate to contact me with questions.  Nehemiah Settle, FNP [facility]

## 2021-05-01 NOTE — Patient Instructions (Addendum)
History of bee sting allergy Your hymenoptera panel on December 22, 2020 was negative and percutaneous and intradermal testing today was negative For mild symptoms you can take over the counter antihistamines such as Benadryl and monitor symptoms closely. If symptoms worsen or if you have severe symptoms including breathing issues, throat closure, significant swelling, whole body hives, severe diarrhea and vomiting, lightheadedness then seek immediate medical care  We will order a repeat tryptase level since your last level was slightly elevated. We will call you with results once they are back  Schedule a follow up appointment in 6 months or sooner if needed

## 2021-06-19 ENCOUNTER — Other Ambulatory Visit: Admitting: Family
